# Patient Record
Sex: Male | Born: 1965 | Hispanic: Yes | Marital: Married | Smoking: Never smoker
Health system: Southern US, Community
[De-identification: ages and names within clinical notes are randomized; demographics above are authoritative.]

## PROBLEM LIST (undated history)

## (undated) DIAGNOSIS — I10 Essential (primary) hypertension: Secondary | ICD-10-CM

## (undated) DIAGNOSIS — E119 Type 2 diabetes mellitus without complications: Secondary | ICD-10-CM

## (undated) DIAGNOSIS — E785 Hyperlipidemia, unspecified: Secondary | ICD-10-CM

---

## 2008-06-17 ENCOUNTER — Emergency Department: Payer: Self-pay | Admitting: Emergency Medicine

## 2011-02-21 ENCOUNTER — Ambulatory Visit: Payer: Self-pay | Admitting: Family Medicine

## 2011-11-17 LAB — BASIC METABOLIC PANEL
Anion Gap: 10 (ref 7–16)
Chloride: 106 mmol/L (ref 98–107)
Co2: 24 mmol/L (ref 21–32)
Creatinine: 0.93 mg/dL (ref 0.60–1.30)
EGFR (Non-African Amer.): >60
Osmolality: 281 (ref 275–301)

## 2011-11-17 LAB — CBC
MCHC: 34.7 g/dL (ref 32.0–36.0)
MCV: 89 fL (ref 80–100)
Platelet: 199 10*3/uL (ref 150–440)
RDW: 12.9 % (ref 11.5–14.5)
WBC: 7.9 10*3/uL (ref 3.8–10.6)

## 2011-11-18 ENCOUNTER — Observation Stay: Payer: Self-pay | Admitting: Internal Medicine

## 2011-11-18 LAB — TROPONIN I
Troponin-I: <0.02 ng/mL
Troponin-I: <0.02 ng/mL

## 2011-11-18 LAB — LIPID PANEL
Cholesterol: 166 mg/dL (ref 0–200)
HDL Cholesterol: 48 mg/dL (ref 40–60)
Ldl Cholesterol, Calc: 91 mg/dL (ref 0–100)
VLDL Cholesterol, Calc: 27 mg/dL (ref 5–40)

## 2014-05-02 NOTE — H&P (Signed)
PATIENT NAME:  Gary Vega, Gary Vega MR#:  [id number] DATE OF BIRTH:  10-20-1965  DATE OF ADMISSION:  11/18/2011  REFERRING PHYSICIAN: Dr. Brandt Loosen  PRIMARY CARE PHYSICIAN: Dr. Darreld Mclean   CHIEF COMPLAINT: Chest pain.   HISTORY OF PRESENT ILLNESS: This is a 49 year old male with significant past medical history of diabetes mellitus on metformin, hypertension, presents with complaint of chest pain. Patient reports chest pain on and off for the last year, no relieving or no provoking factor. Reports having episode of chest pain every few weeks without relieving or provoking factors. Patient reports his chest pain worsened yesterday which prompted him to come to ED. Patient reports chest pain started yesterday afternoon when he was driving, reports it improved after he took his lisinopril. Complaining of some mild shortness of breath, palpitations, nausea, but denies any diaphoresis. Upon presentation to ED patient was chest pain free. Patient received 324 of aspirin and nitro paste. Patient had EKG done which did not show any significant EKG changes, no ST or T wave changes. Patient had first set of cardiac enzyme done which was negative. Patient is known to have history of diabetes mellitus, hypertension, reports remote history of smoking, quit in 1989, no smoking since then. As well reports some reflux and heartburn accompanying his chest pain, reports this occasionally radiating to the left shoulder, describes it as a stabbing pain.   PAST MEDICAL HISTORY:  1. Diabetes mellitus.  2. Hypertension.   PAST SURGICAL HISTORY: Reports finger surgery.   ALLERGIES: No known drug allergies.   HOME MEDICATIONS:  1. Diclofenac for left knee pain.  2. Glucosamine supplement.  3. Metformin.  4. Lisinopril.   FAMILY HISTORY: Denies any family history of cardiac disease at young age, reports history of diabetes disease in the family.   SOCIAL HISTORY: Patient quit smoking in 1989. Used to smoke 1 pack  every three days prior to that. No history of alcohol or drug abuse.   REVIEW OF SYSTEMS: CONSTITUTIONAL: Denies any fever, fatigue, weakness. EYES: Denies blurry vision, double vision or pain. ENT: Denies tinnitus, ear pain, hearing loss. RESPIRATORY: Denies cough, wheezing, hemoptysis or painful respirations. CARDIOVASCULAR: Has complaints of chest pain occasionally radiating to the left shoulder, currently resolved. Denies any edema, arrhythmia. Has mild palpitation. Denies any syncope. GASTROINTESTINAL: Had mild nausea. Denies any vomiting, diarrhea, abdominal pain, hematemesis, melena. GENITOURINARY: Denies dysuria, hematuria, renal colic. ENDO: Denies polyuria, polydipsia, heat or cold intolerance. INTEGUMENT: Denies any acne, rash, or lesions. MUSCULOSKELETAL: Denies any neck pain, back pain, muscle pain. Has left knee pain. Denies any gout, redness, or swelling. NEUROLOGICAL: Denies numbness, weakness, dysarthria, epilepsy, tremors, vertigo, ataxia. PSYCHIATRIC: Denies anxiety, insomnia, nervousness, schizophrenia, bipolar disorder.   PHYSICAL EXAMINATION:  VITAL SIGNS: Temperature 97.9, pulse 58, respiratory rate 18, blood pressure 105/65, saturating 99% on room air.   GENERAL: Young male, mildly obese, looks comfortable, in no apparent distress.   HEENT: Head atraumatic, normocephalic. Pupils equal, reactive to light. Pink conjunctivae. Anicteric sclerae. Moist oral mucosa.   NECK: Supple. No thyromegaly. No JVD.   CHEST: Good air entry bilaterally. No wheezing, rales, rhonchi. Has musculoskeletal chest tenderness in the left chest area, but reports this pain is different than his presenting pain.   CARDIOVASCULAR: S1, S2 heard. No rubs, murmur, gallops.   ABDOMEN: Obese, soft, nontender, nondistended. Bowel sounds present.   EXTREMITIES: No edema, no clubbing, no cyanosis.   PSYCHIATRIC: Appropriate affect. Awake, alert x3. Intact judgment and insight.   NEUROLOGIC: Cranial nerves  grossly intact. Motor 5/5. No focal deficits. Sensation symmetrical and intact.   PSYCHIATRIC: Appropriate affect. Awake, alert x3. Intact judgment and insight.   SKIN: No rash. Normal skin turgor.  LABORATORY, DIAGNOSTIC AND RADIOLOGICAL DATA: Glucose 112, BUN 16, creatinine 0.93, sodium 140, potassium 3.7, chloride 106, CO2 24. Troponin less than 0.02 x2. White blood cells 7.9, hemoglobin 14.1, hematocrit 40.6, platelets 199.   EKG showing normal sinus rhythm without any ST or T wave changes.   ASSESSMENT AND PLAN: This is a 49 year old male with significant past medical history of diabetes mellitus, hypertension, remote history of smoking, and obesity presents with complaints of chest pain, currently resolved.  1. Chest pain. Patient was given 324 of aspirin, started on nitro paste. Currently he is chest pain free, has no EKG changes. He has negative troponins x2. Patient will be scheduled for a stress test in a.m.  2. Hypertension, acceptable. Continue with lisinopril.  3. Diabetes mellitus. As patient is n.p.o. hold metformin, have him on insulin sliding scale. Check a glycohemoglobin.  4. Deep vein thrombosis prophylaxis. Sub-Q heparin.  5. CODE STATUS: FULL CODE.   TOTAL TIME SPENT ON ADMISSION AND PATIENT CARE: 45 minutes.    Patient is Spanish-speaking so Spanish interpreter was used, Dr. Linde GillisIrwing, from ED department.   ____________________________ Starleen Armsawood S. Haim Hansson, MD dse:cms D: 11/18/2011 05:20:54 ET T: 11/18/2011 08:22:11 ET  JOB#: [id number] cc: Starleen Armsawood S. Harrel Ferrone, MD, <Dictator> Leanna SatoLinda M. Miles, MD [facility] Teena IraniS Mehar Sagen MD ELECTRONICALLY SIGNED 11/19/2011 0:29

## 2014-05-02 NOTE — Discharge Summary (Signed)
PATIENT NAME:  Gary Vega, Gary Vega MR#:  [id number] DATE OF BIRTH:  September 24, 1965  DATE OF ADMISSION:  11/18/2011 DATE OF DISCHARGE:  11/18/2011  PRIMARY CARE PHYSICIAN: Darreld McleanLinda Miles, MD  FINAL DIAGNOSES:  1. Chest pain, possible anxiety.  2. Hypertension.  3. Diabetes.   MEDICATIONS ON DISCHARGE:  1. Lisinopril 10 mg daily.  2. Metformin 500 mg daily.  3. Glucosamine/chondroitin 1500 mg daily.  4. Aspirin 81 mg daily.   NOTE: Stop taking diclofenac.   DIET: Low-fat, low-cholesterol, carbohydrate-controlled regular diet. Follow-up next week with Dr. Darreld McleanLinda Miles.   REASON FOR ADMISSION: The patient was admitted with chest pain.   HISTORY OF PRESENT ILLNESS: The patient is a 49 year old man with diabetes and hypertension having chest pain on and off for the last year, worsening over the last few days. He described it as sharp, lasting a few minutes at a time, but kept on repeatedly happening. He recently had some bad news that his 51 year old daughter is pregnant and that his other daughter was in the hospital numerous times recently, having extra stresses.   LABORATORY, DIAGNOSTIC AND RADIOLOGIC DATA: EKG showed normal sinus rhythm, no acute ST-T wave changes.   Troponins were negative x3. White blood cell count 7.9, hemoglobin and hematocrit 14.1 and 40.6, and platelet count 199. Glucose 112, BUN 16, creatinine 0.93, sodium 140, potassium 3.7, chloride 106, CO2 24, and calcium 9.0.   Chest x-ray showed no acute cardiopulmonary disease.   Troponins were negative. Hemoglobin A1c 8.1. LDL 91, HDL 48, and triglycerides [id number].   Stress test showed good exercise tolerance. No arrhythmia or ischemia. Normal left ventricular function. No evidence of infarction or ischemia.   HOSPITAL COURSE:  1. The patient was discharged home in stable condition. His chest pain is not cardiac. Cardiac enzymes were negative and stress test was negative. Possible anxiety from news that he recently got. I do  recommend follow-up with Dr. Darreld McleanLinda Miles next week.  2. For his hypertension, he was kept on his lisinopril. Blood pressure upon discharge was 112/72.  3. For his diabetes, he is on metformin. Hemoglobin A1c is actually elevated, but sugars here were in reasonable range. Sugar upon discharge is 98.  TIME SPENT ON DISCHARGE: 35 minutes.  ____________________________ Herschell Dimesichard J. Renae GlossWieting, MD rjw:slb D: 11/19/2011 15:02:04 ET T: 11/20/2011 11:06:17 ET JOB#: [id number]  cc: Herschell Dimesichard J. Renae GlossWieting, MD, <Dictator> Leanna SatoLinda M. Miles, MD Salley ScarletICHARD J Graci Hulce MD ELECTRONICALLY SIGNED 11/22/2011 17:52

## 2015-05-02 ENCOUNTER — Other Ambulatory Visit: Payer: Self-pay | Admitting: Nurse Practitioner

## 2015-05-02 ENCOUNTER — Ambulatory Visit
Admission: RE | Admit: 2015-05-02 | Discharge: 2015-05-02 | Disposition: A | Discharge status: Home / Self Care | Payer: Self-pay | Source: Ambulatory Visit | Attending: Nurse Practitioner | Admitting: Nurse Practitioner

## 2015-05-02 DIAGNOSIS — M7582 Other shoulder lesions, left shoulder: Secondary | ICD-10-CM | POA: Insufficient documentation

## 2015-05-02 DIAGNOSIS — R52 Pain, unspecified: Secondary | ICD-10-CM

## 2015-05-02 DIAGNOSIS — R938 Abnormal findings on diagnostic imaging of other specified body structures: Secondary | ICD-10-CM | POA: Insufficient documentation

## 2015-05-02 DIAGNOSIS — M79602 Pain in left arm: Secondary | ICD-10-CM | POA: Insufficient documentation

## 2016-01-14 HISTORY — PX: SHOULDER SURGERY: SHX246

## 2017-11-10 IMAGING — CR DG SHOULDER 2+V*L*
1 series · 3 of 3 positions shown · non-contrast
Comparison: None.

CLINICAL DATA: Left shoulder pain, particularly when raising the
arm, fell last week with limited range of motion

EXAM:
LEFT SHOULDER - 2+ VIEW

[Series 1: dg shoulder left · 0.14mm/px · 3 of 3 slices shown]
[im 1/3]
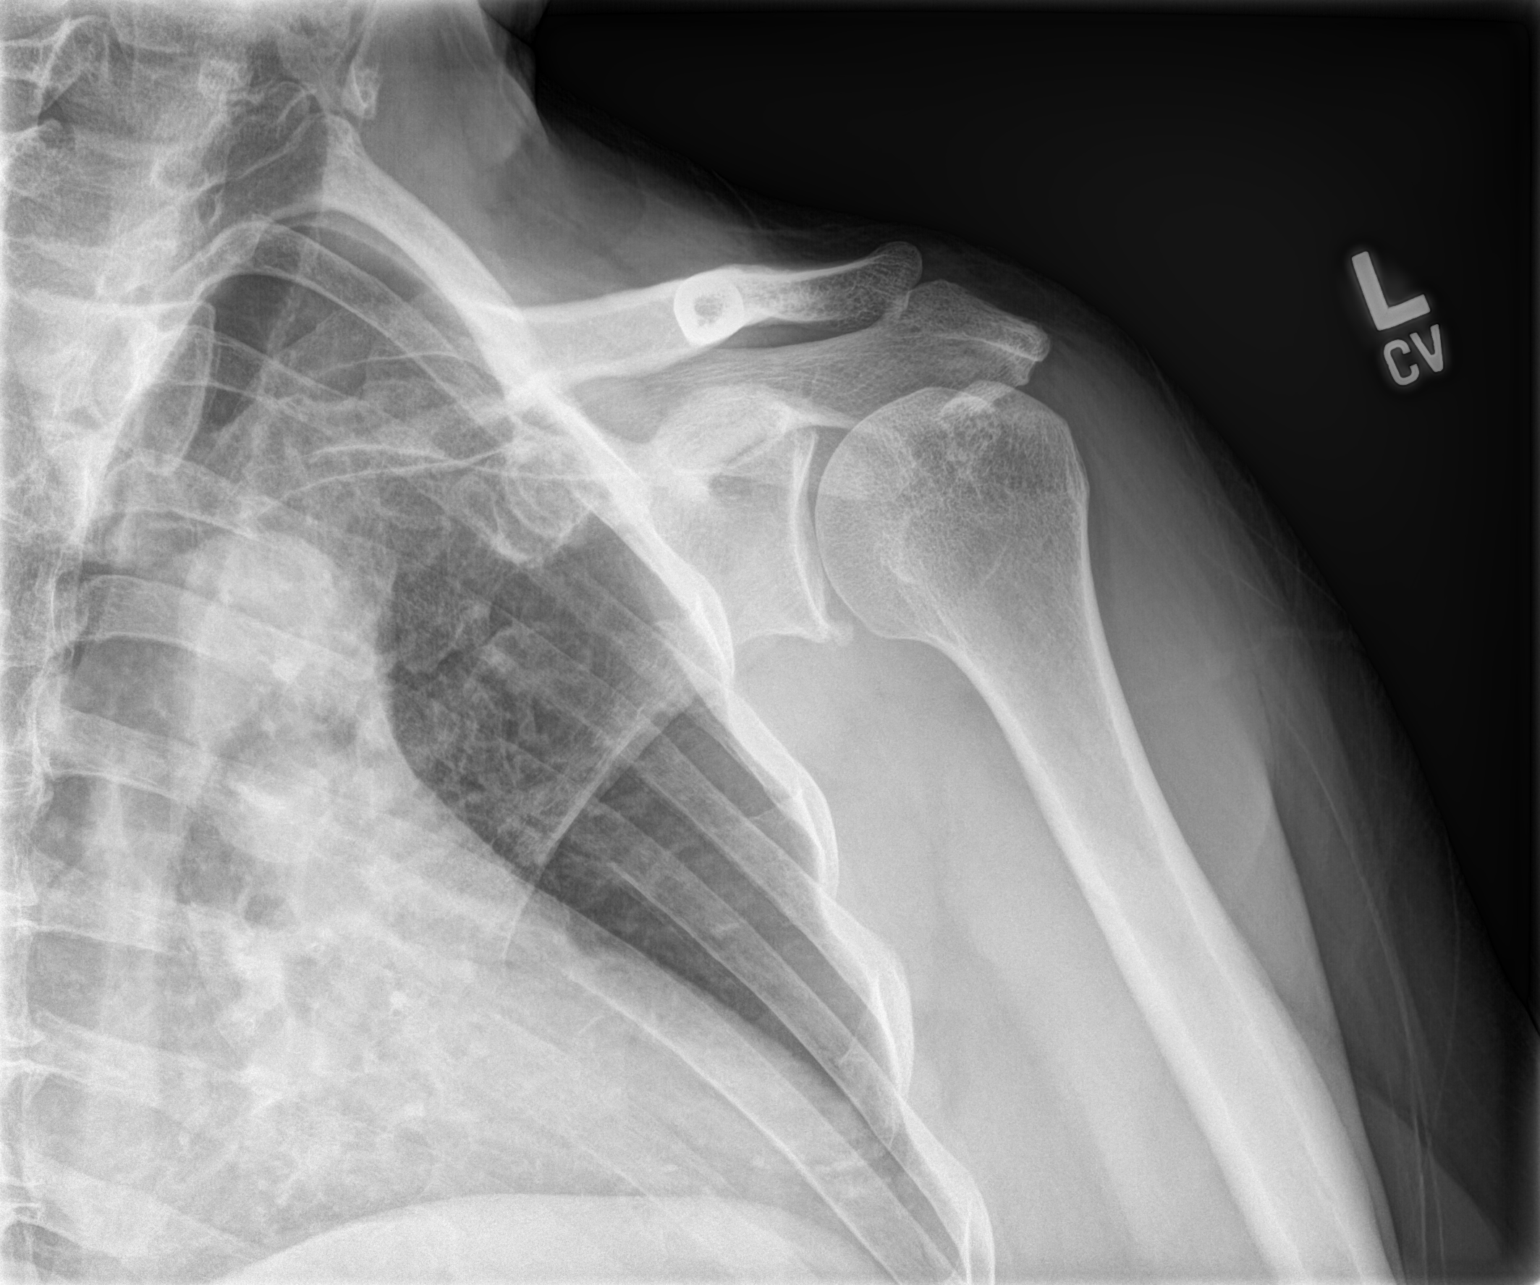
[im 2/3]
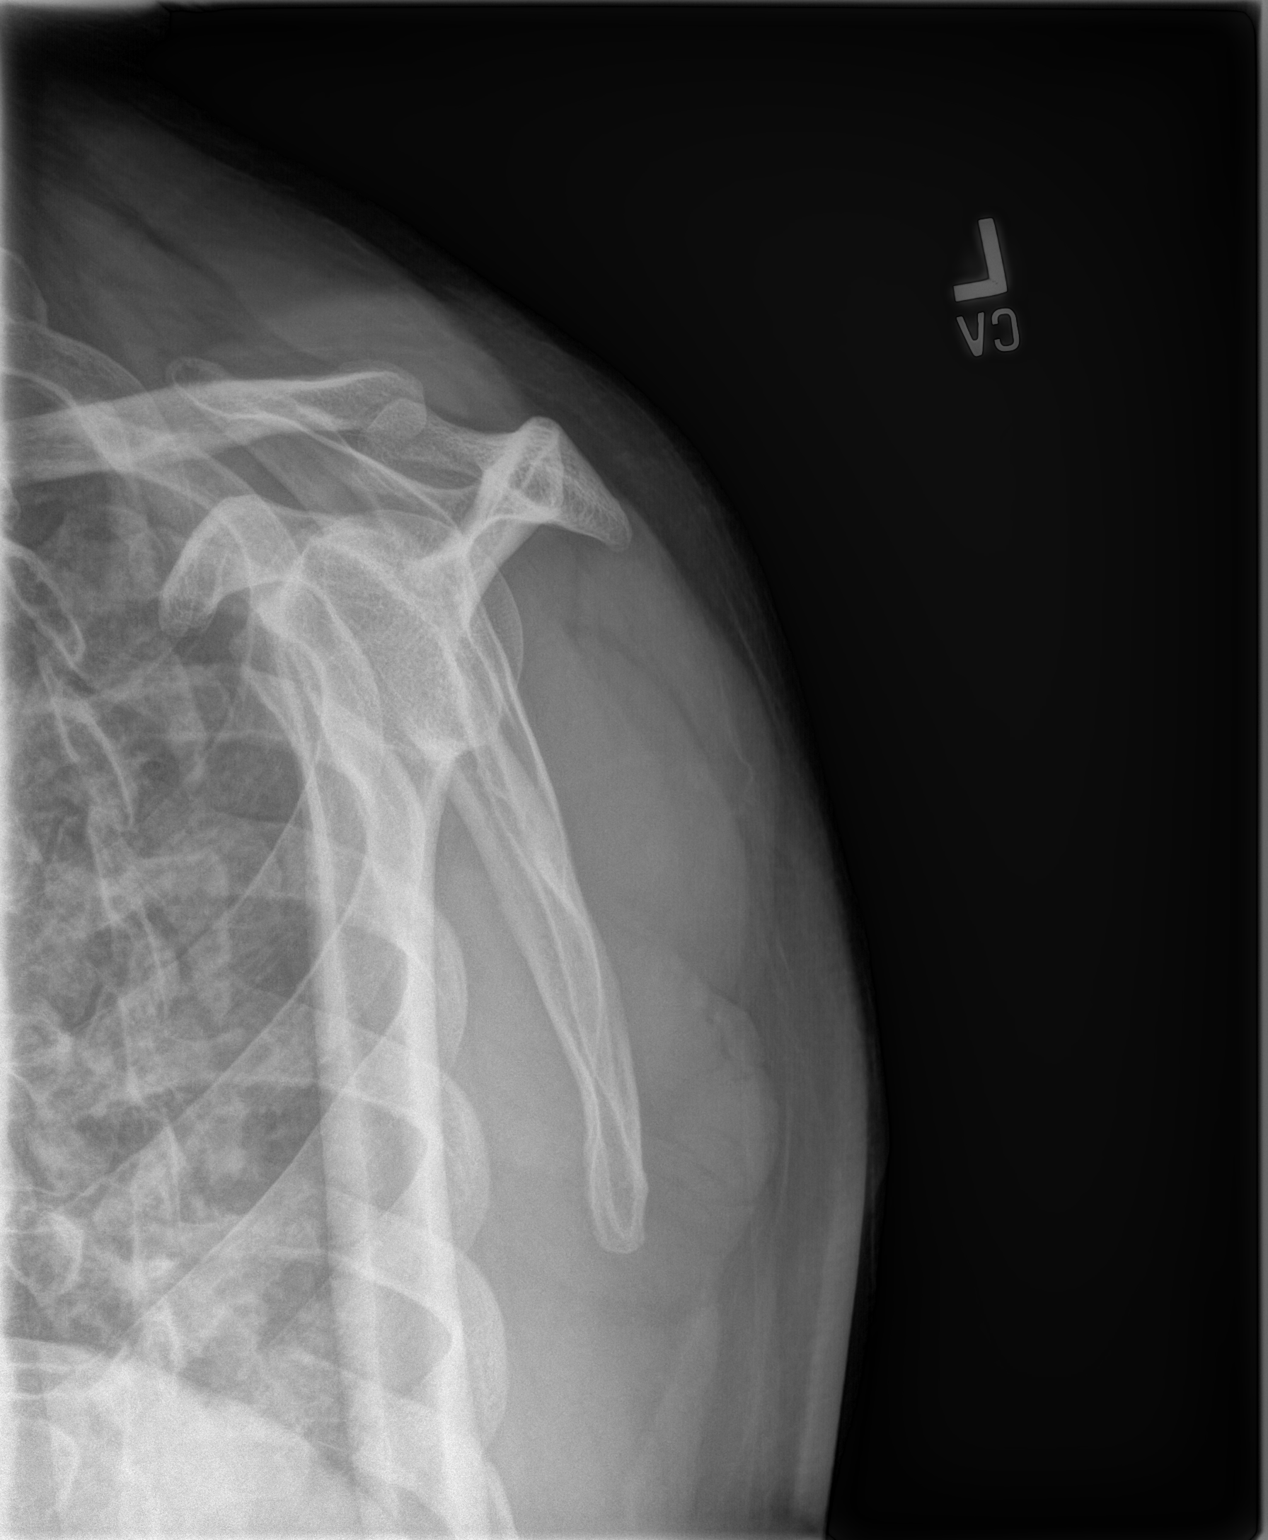
[im 3/3]
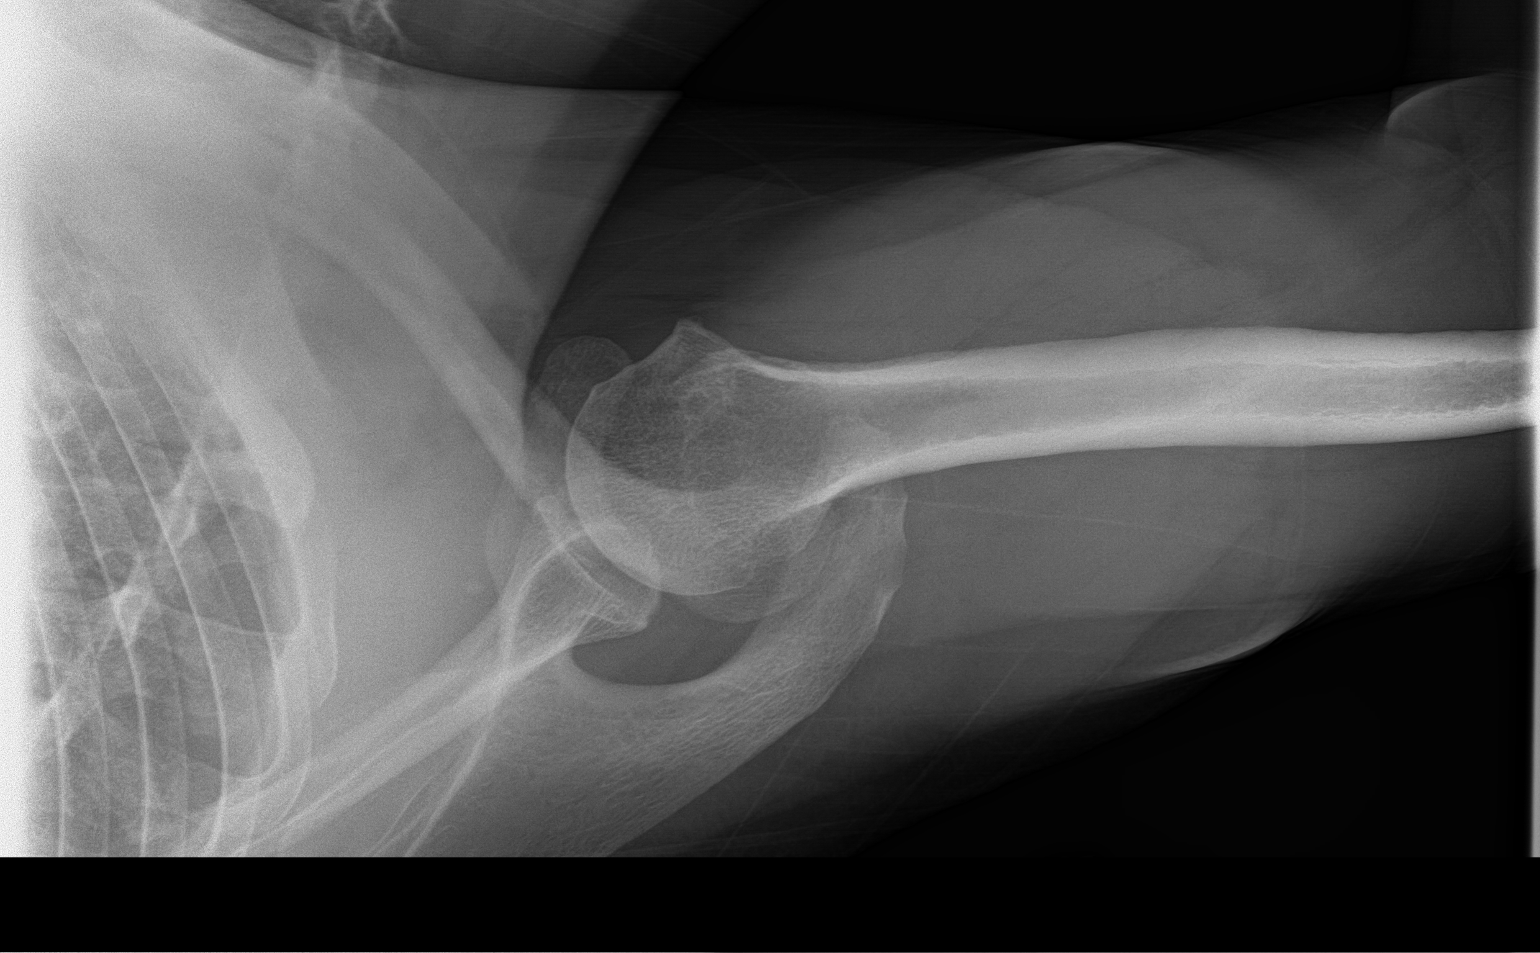

[3 of 3 positions shown; findings below may reference images not displayed]

FINDINGS: The left humeral head is in normal position and the glenohumeral
joint space is unremarkable. There is a bony density adjacent to the
inferior glenoid rim, and a small bone fragment cannot be excluded.
Also there does appear to be some acromial spurring which may
predispose to impingement. The left AC joint is normally aligned.
IMPRESSION: 1. Acromial spurring may predispose to impingement.
2. Small bony density adjacent to the inferior glenoid rim could
represent an avulsion fracture fragment. If clinically suspicious
consider MRI for more sensitive assessment.

## 2019-04-18 ENCOUNTER — Ambulatory Visit: Payer: Self-pay | Attending: Internal Medicine

## 2019-04-18 DIAGNOSIS — Z23 Encounter for immunization: Secondary | ICD-10-CM

## 2019-04-18 NOTE — Progress Notes (Signed)
   Covid-19 Vaccination Clinic  Name:  Gary Vega    MRN: [id number] DOB: 11/09/1965  04/18/2019  Mr. Gary Vega was observed post Covid-19 immunization for 15 minutes without incident. He was provided with Vaccine Information Sheet and instruction to access the V-Safe system.   Mr. Gary Vega was instructed to call 911 with any severe reactions post vaccine: Marland Kitchen Difficulty breathing  . Swelling of face and throat  . A fast heartbeat  . A bad rash all over body  . Dizziness and weakness   Immunizations Administered    Name Date Dose VIS Date Route   Pfizer COVID-19 Vaccine 04/18/2019  8:45 AM 0.3 mL 12/24/2018 Intramuscular   Manufacturer: [employer], [employer]   Lot: [id number]   NDC: [address]-8

## 2019-05-11 ENCOUNTER — Ambulatory Visit: Payer: Self-pay | Attending: Internal Medicine

## 2019-05-11 DIAGNOSIS — Z23 Encounter for immunization: Secondary | ICD-10-CM

## 2019-05-11 NOTE — Progress Notes (Signed)
   Covid-19 Vaccination Clinic  Name:  Gary Vega    MRN: [id number] DOB: 1965-11-06  05/11/2019  Mr. Gary Vega was observed post Covid-19 immunization for 15 minutes without incident. He was provided with Vaccine Information Sheet and instruction to access the V-Safe system.   Mr. Gary Vega was instructed to call 911 with any severe reactions post vaccine: Marland Kitchen Difficulty breathing  . Swelling of face and throat  . A fast heartbeat  . A bad rash all over body  . Dizziness and weakness   Immunizations Administered    Name Date Dose VIS Date Route   Pfizer COVID-19 Vaccine 05/11/2019  9:22 AM 0.3 mL 03/09/2018 Intramuscular   Manufacturer: [employer], [employer]   Lot: [id number]   NDC: [address]-5

## 2020-01-26 ENCOUNTER — Other Ambulatory Visit: Payer: Self-pay

## 2020-01-26 DIAGNOSIS — Z20822 Contact with and (suspected) exposure to covid-19: Secondary | ICD-10-CM

## 2020-01-28 LAB — NOVEL CORONAVIRUS, NAA: SARS-CoV-2, NAA: NOT DETECTED

## 2020-01-28 LAB — SARS-COV-2, NAA 2 DAY TAT

## 2020-02-08 ENCOUNTER — Telehealth: Payer: Self-pay | Admitting: Family Medicine

## 2020-02-08 NOTE — Telephone Encounter (Signed)
Negative COVID results given. Patient results "NOT Detected." Caller expressed understanding. ° °

## 2021-09-04 ENCOUNTER — Ambulatory Visit (INDEPENDENT_AMBULATORY_CARE_PROVIDER_SITE_OTHER): Payer: Self-pay

## 2021-09-04 ENCOUNTER — Ambulatory Visit
Admission: EM | Admit: 2021-09-04 | Discharge: 2021-09-04 | Disposition: A | Discharge status: Home / Self Care | Payer: Self-pay | Attending: Family Medicine | Admitting: Family Medicine

## 2021-09-04 DIAGNOSIS — S62663B Nondisplaced fracture of distal phalanx of left middle finger, initial encounter for open fracture: Secondary | ICD-10-CM

## 2021-09-04 DIAGNOSIS — S62662A Nondisplaced fracture of distal phalanx of right middle finger, initial encounter for closed fracture: Secondary | ICD-10-CM

## 2021-09-04 DIAGNOSIS — S62660A Nondisplaced fracture of distal phalanx of right index finger, initial encounter for closed fracture: Secondary | ICD-10-CM

## 2021-09-04 DIAGNOSIS — S61313A Laceration without foreign body of left middle finger with damage to nail, initial encounter: Secondary | ICD-10-CM

## 2021-09-04 DIAGNOSIS — S62661A Nondisplaced fracture of distal phalanx of left index finger, initial encounter for closed fracture: Secondary | ICD-10-CM

## 2021-09-04 HISTORY — DX: Type 2 diabetes mellitus without complications: E11.9

## 2021-09-04 HISTORY — DX: Essential (primary) hypertension: I10

## 2021-09-04 HISTORY — DX: Hyperlipidemia, unspecified: E78.5

## 2021-09-04 MED ORDER — TRAMADOL HCL 50 MG PO TABS: 50.0000 mg | ORAL_TABLET | Freq: Four times a day (QID) | ORAL | 0 refills | Status: AC | PRN

## 2021-09-04 MED ORDER — CEPHALEXIN 500 MG PO CAPS: 500.0000 mg | ORAL_CAPSULE | Freq: Three times a day (TID) | ORAL | 0 refills | Status: AC

## 2021-09-04 NOTE — ED Provider Notes (Signed)
[facility]    CSN: [id number] Arrival date & time: 09/04/21  1856      History   Chief Complaint Chief Complaint  Patient presents with   Laceration    HPI Gary Vega is a 56 y.o. male.   HPI He was cutting grass and he noticed the grass was getting struck therefore he stuck his hand underneath the machine and it cut his hand. Pain and swelling on his right index finger.  He endorses pain. He bandaged and washed the wound prior to arrival.   He is right handed. Daughter reports patient got his last tetanus "not that long ago." Has previous injuries on the same hand before.    *** Fever : no  Chills: no Sore throat: no   Cough: no Sputum: no Nasal congestion : no  Appetite: normal  Hydration: normal  Abdominal pain: no Nausea: no Vomiting: no Dysuria: no  Sleep disturbance: no Back Pain: no Headache: no   Past Medical History:  Diagnosis Date   Diabetes mellitus without complication (HCC)    Hyperlipidemia    Hypertension     There are no problems to display for this patient.   Past Surgical History:  Procedure Laterality Date   SHOULDER SURGERY Left 2018       Home Medications    Prior to Admission medications   Medication Sig Start Date End Date Taking? Authorizing Provider  atorvastatin (LIPITOR) 10 MG tablet Take 10 mg by mouth daily. 08/14/21  Yes [provider]  FEROSUL 325 (65 Fe) MG tablet Take by mouth. 08/06/21  Yes [provider]  glipiZIDE (GLUCOTROL XL) 10 MG 24 hr tablet Take by mouth.   Yes [provider]  LANTUS SOLOSTAR 100 UNIT/ML Solostar Pen Inject into the skin. 07/30/21  Yes [provider]  lisinopril (ZESTRIL) 20 MG tablet Take 20 mg by mouth daily. 08/14/21  Yes [provider]  metFORMIN (GLUCOPHAGE-XR) 500 MG 24 hr tablet Take 1,000 mg by mouth 2 (two) times daily. 06/05/21  Yes [provider]  TRADJENTA 5 MG TABS tablet Take 5 mg by mouth  daily. 08/06/21  Yes [provider]  TRUEPLUS 5-BEVEL PEN NEEDLES 31G X 6 MM MISC  08/14/21  Yes [provider]    Family History History reviewed. No pertinent family history.  Social History Social History   Tobacco Use   Smoking status: Never   Smokeless tobacco: Never  Vaping Use   Vaping Use: Never used  Substance Use Topics   Alcohol use: Never     Allergies   Etodolac   Review of Systems Review of Systems : :negative unless otherwise stated in HPI.      Physical Exam Triage Vital Signs ED Triage Vitals  Enc Vitals Group     BP 09/04/21 1930 120/73     Pulse Rate 09/04/21 1930 (!) 110     Resp --      Temp 09/04/21 1930 98.5 F (36.9 C)     Temp Source 09/04/21 1930 Oral     SpO2 09/04/21 1930 98 %     Weight 09/04/21 1925 215 lb (97.5 kg)     Height 09/04/21 1925 5\' 7"  (1.702 m)     Head Circumference --      Peak Flow --      Pain Score 09/04/21 1925 10     Pain Loc --      Pain Edu? --  Excl. in GC? --    No data found.  Updated Vital Signs BP 120/73 (BP Location: Left Arm)   Pulse (!) 110   Temp 98.5 F (36.9 C) (Oral)   Ht 5\' 7"  (1.702 m)   Wt 97.5 kg   SpO2 98%   BMI 33.67 kg/m   Visual Acuity Right Eye Distance:   Left Eye Distance:   Bilateral Distance:    Right Eye Near:   Left Eye Near:    Bilateral Near:     Physical Exam  GEN: pleasant well appearing fe***male, in no acute distress *** CV: regular rate and rhythm, no murmurs appreciated *** RESP: no increased work of breathing, clear to ascultation bilaterally ABD: Bowel sounds present. Soft, non-tender, non-distended. *** MSK: no edema, or calf tenderness SKIN: warm, dry, no rash on visible skin NEURO: alert, moves all extremities appropriately PSYCH: Normal affect, appropriate speech and behavior   UC Treatments / Results  Labs (all labs ordered are listed, but only abnormal results are displayed) Labs Reviewed - No data to  display  EKG   Radiology No results found.  Procedures Procedures (including critical care time)  Medications Ordered in UC Medications - No data to display  Initial Impression / Assessment and Plan / UC Course  I have reviewed the triage vital signs and the nursing notes.  Pertinent labs & imaging results that were available during my care of the patient were reviewed by me and considered in my medical decision making (see chart for details).   On chart review, last tetanus was 11/25/2019.     *** Final Clinical Impressions(s) / UC Diagnoses   Final diagnoses:  None   Discharge Instructions   None    ED Prescriptions   None    PDMP not reviewed this encounter.

## 2021-09-04 NOTE — ED Triage Notes (Signed)
Pt here for laceration to RT middle finger DOI: 09/04/21 pt states he stuck hand under lawnmower, RT index finger swollen

## 2021-09-04 NOTE — Discharge Instructions (Addendum)
Pase por la farmacia para recoger sus recetas. 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 Si empieza a Gaffer intenso, tome Tramadol. No conduzca ni maneje maquinaria pesada mientras toma este medicamento.  Stop by the pharmacy to pick up your prescriptions.  Take Tylenol as needed for pain. If you start having severe pain, take Tramadol. Do not drive or operate heavy machinery while taking this medication.

## 2021-09-14 ENCOUNTER — Ambulatory Visit
Admission: EM | Admit: 2021-09-14 | Discharge: 2021-09-14 | Disposition: A | Discharge status: Home / Self Care | Payer: Self-pay

## 2021-09-14 NOTE — ED Triage Notes (Addendum)
Patient is here for suture removal. 4 was placed on right middle finger on 09/04/21.

## 2021-09-14 NOTE — ED Notes (Signed)
4 sutures removed. Pt tolerated procedure well.  

## 2023-01-23 ENCOUNTER — Other Ambulatory Visit: Payer: Self-pay

## 2023-01-23 ENCOUNTER — Emergency Department: Payer: Self-pay

## 2023-01-23 ENCOUNTER — Emergency Department
Admission: EM | Admit: 2023-01-23 | Discharge: 2023-01-23 | Disposition: A | Discharge status: Home / Self Care | Payer: Self-pay | Attending: Emergency Medicine | Admitting: Emergency Medicine

## 2023-01-23 DIAGNOSIS — R1031 Right lower quadrant pain: Secondary | ICD-10-CM | POA: Insufficient documentation

## 2023-01-23 DIAGNOSIS — I10 Essential (primary) hypertension: Secondary | ICD-10-CM | POA: Insufficient documentation

## 2023-01-23 DIAGNOSIS — E119 Type 2 diabetes mellitus without complications: Secondary | ICD-10-CM | POA: Insufficient documentation

## 2023-01-23 LAB — COMPREHENSIVE METABOLIC PANEL
ALT: 15 U/L (ref 0–44)
AST: 17 U/L (ref 15–41)
Albumin: 4.2 g/dL (ref 3.5–5.0)
Alkaline Phosphatase: 64 U/L (ref 38–126)
Anion gap: 11 (ref 5–15)
BUN: 25 mg/dL — ABNORMAL HIGH (ref 6–20)
CO2: 20 mmol/L — ABNORMAL LOW (ref 22–32)
Calcium: 9.1 mg/dL (ref 8.9–10.3)
Chloride: 104 mmol/L (ref 98–111)
Creatinine, Ser: 1.33 mg/dL — ABNORMAL HIGH (ref 0.61–1.24)
GFR, Estimated: >60 mL/min (ref >60)
Glucose, Bld: 118 mg/dL — ABNORMAL HIGH (ref 70–99)
Potassium: 4.4 mmol/L (ref 3.5–5.1)
Sodium: 135 mmol/L (ref 135–145)
Total Bilirubin: 0.5 mg/dL (ref 0.0–1.2)
Total Protein: 7.5 g/dL (ref 6.5–8.1)

## 2023-01-23 LAB — URINALYSIS, ROUTINE W REFLEX MICROSCOPIC
Bilirubin Urine: NEGATIVE
Glucose, UA: NEGATIVE mg/dL
Hgb urine dipstick: NEGATIVE
Ketones, ur: NEGATIVE mg/dL
Leukocytes,Ua: NEGATIVE
Nitrite: NEGATIVE
Protein, ur: NEGATIVE mg/dL
Specific Gravity, Urine: 1.008 (ref 1.005–1.030)
pH: 6 (ref 5.0–8.0)

## 2023-01-23 LAB — CBC
HCT: 36.7 % — ABNORMAL LOW (ref 39.0–52.0)
Hemoglobin: 12.6 g/dL — ABNORMAL LOW (ref 13.0–17.0)
MCH: 30.6 pg (ref 26.0–34.0)
MCHC: 34.3 g/dL (ref 30.0–36.0)
MCV: 89.1 fL (ref 80.0–100.0)
Platelets: 220 10*3/uL (ref 150–400)
RBC: 4.12 MIL/uL — ABNORMAL LOW (ref 4.22–5.81)
RDW: 12.3 % (ref 11.5–15.5)
WBC: 9.7 10*3/uL (ref 4.0–10.5)
nRBC: 0 % (ref 0.0–0.2)

## 2023-01-23 LAB — LIPASE, BLOOD: Lipase: 31 U/L (ref 11–51)

## 2023-01-23 MED ORDER — NAPROXEN 500 MG PO TABS: 500.0000 mg | ORAL_TABLET | Freq: Two times a day (BID) | ORAL | 0 refills | Status: AC

## 2023-01-23 MED ORDER — LIDOCAINE 5 % EX PTCH: 1.0000 | MEDICATED_PATCH | Freq: Two times a day (BID) | CUTANEOUS | 0 refills | Status: AC

## 2023-01-23 MED ORDER — KETOROLAC TROMETHAMINE 15 MG/ML IJ SOLN
15.0000 mg | Freq: Once | INTRAMUSCULAR | Status: AC
Start: 2023-01-23 — End: 2023-01-23
  Administered 2023-01-23: 15 mg via INTRAMUSCULAR
  Filled 2023-01-23: qty 1

## 2023-01-23 MED ORDER — OXYCODONE-ACETAMINOPHEN 5-325 MG PO TABS: 1.0000 | ORAL_TABLET | ORAL | 0 refills | Status: AC | PRN

## 2023-01-23 MED ORDER — OXYCODONE-ACETAMINOPHEN 5-325 MG PO TABS: 1.0000 | ORAL_TABLET | ORAL | 0 refills | Status: DC | PRN

## 2023-01-23 NOTE — ED Triage Notes (Addendum)
 Pt comes with c/o RLQ pain for few days. Pt went to pcp and was recommended to come here. Pt does have some pain with urination but due to prostate issue. Pt states pressure pain that is constant.  Pt denies any N/V.  Pt states he did eat some fish and may have swallowed a bone. Pt states the pain did start after that.

## 2023-01-23 NOTE — ED Provider Notes (Signed)
 [facility] Provider Note    Event Date/Time   First MD Initiated Contact with Patient 01/23/23 0730     (approximate)   History   Chief Complaint: Abdominal Pain   HPI  Gary Vega is a 58 y.o. male with history of hypertension, diabetes who comes ED complaining of right lower quadrant abdominal pain for the past 4 days, waxing and waning, no aggravating or alleviating factors.  Radiates around to the right lower back.  Endorses dysuria, no hematuria.  No vomiting or fever.  Patient has known prostatic hyperplasia.  Patient no pain started after he was eating, was worried that he accidentally swallowed a fishbone, and went to the bathroom immediately to force himself to vomit with severe abdominal straining.          Physical Exam   Triage Vital Signs: ED Triage Vitals  Encounter Vitals Group     BP 01/23/23 0705 134/72     Systolic BP Percentile --      Diastolic BP Percentile --      Pulse Rate 01/23/23 0705 80     Resp 01/23/23 0705 17     Temp 01/23/23 0705 98.7 F (37.1 C)     Temp Source 01/23/23 0705 Oral     SpO2 01/23/23 0705 100 %     Weight 01/23/23 0709 207 lb (93.9 kg)     Height 01/23/23 0709 5' 7 (1.702 m)     Head Circumference --      Peak Flow --      Pain Score 01/23/23 0709 10     Pain Loc --      Pain Education --      Exclude from Growth Chart --     Most recent vital signs: Vitals:   01/23/23 0705  BP: 134/72  Pulse: 80  Resp: 17  Temp: 98.7 F (37.1 C)  SpO2: 100%    General: Awake, no distress.  CV:  Good peripheral perfusion.  Resp:  Normal effort.  Abd:  No distention.  Mild right lower quadrant tenderness, not focal to McBurney's point Other:  Moist oral mucosa   ED Results / Procedures / Treatments   Labs (all labs ordered are listed, but only abnormal results are displayed) Labs Reviewed  COMPREHENSIVE METABOLIC PANEL - Abnormal; Notable for the following components:       Result Value   CO2 20 (*)    Glucose, Bld 118 (*)    BUN 25 (*)    Creatinine, Ser 1.33 (*)    All other components within normal limits  CBC - Abnormal; Notable for the following components:   RBC 4.12 (*)    Hemoglobin 12.6 (*)    HCT 36.7 (*)    All other components within normal limits  LIPASE, BLOOD  URINALYSIS, ROUTINE W REFLEX MICROSCOPIC     EKG    RADIOLOGY CT abdomen pelvis interpreted by me, shows atrophic right kidney, no evidence of ureterolithiasis.  No obvious inflammatory changes in the right lower quadrant.  Radiology report reviewed   PROCEDURES:  Procedures   MEDICATIONS ORDERED IN ED: Medications  ketorolac  (TORADOL ) 15 MG/ML injection 15 mg (15 mg Intramuscular Given 01/23/23 0819)     IMPRESSION / MDM / ASSESSMENT AND PLAN / ED COURSE  I reviewed the triage vital signs and the nursing notes.  DDx: Appendicitis, ureterolithiasis, constipation, diverticulitis, UTI, urinary retention, abdominal wall strain  Patient's presentation is most consistent with acute presentation  with potential threat to life or bodily function.  Patient presents with right lower quadrant pain, mild tenderness.  Vital signs are normal.  Serum labs unremarkable, no leukocytosis.  Will obtain UA and CT.   Clinical Course as of 01/23/23 1034  Fri Jan 23, 2023  0842 RBC(!): 4.12 [JW]  0940 Color, Urine(!): STRAW [JW]    Clinical Course User Index [JW] Gary Vega     ----------------------------------------- 10:34 AM on 01/23/2023 ----------------------------------------- Labs, CT all unremarkable.  Patient is ambulatory, nontoxic.  Repeat abdominal exam reveals persistent tenderness over the right mid abdomen, negative Murphy sign, no tenderness in the right upper quadrant, no tenderness at McBurney's point.  No CVA tenderness.  Stable for discharge.  FINAL CLINICAL IMPRESSION(S) / ED DIAGNOSES   Final diagnoses:  None     Rx / DC Orders   ED  Discharge Orders     None        Note:  This document was prepared using Dragon voice recognition software and may include unintentional dictation errors.   Viviann Pastor, MD 01/23/23 [phone]

## 2023-01-23 NOTE — ED Notes (Signed)
 See triage note  Presents with pain to right side of abd  which started 4 days ago   States pain has been constant  and moves into lower back  Afebrile on arrival
